# Patient Record
Sex: Male | Born: 1945
Health system: Southern US, Community
[De-identification: ages and names within clinical notes are randomized; demographics above are authoritative.]

## PROBLEM LIST (undated history)

## (undated) DIAGNOSIS — E119 Type 2 diabetes mellitus without complications: Secondary | ICD-10-CM

## (undated) DIAGNOSIS — I10 Essential (primary) hypertension: Secondary | ICD-10-CM

## (undated) DIAGNOSIS — N189 Chronic kidney disease, unspecified: Secondary | ICD-10-CM

## (undated) HISTORY — DX: Type 2 diabetes mellitus without complications: E11.9

## (undated) HISTORY — DX: Chronic kidney disease, unspecified: N18.9

---

## 1998-06-10 ENCOUNTER — Emergency Department (HOSPITAL_COMMUNITY): Admission: EM | Admit: 1998-06-10 | Discharge: 1998-06-10 | Payer: Self-pay

## 2005-02-20 ENCOUNTER — Emergency Department (HOSPITAL_COMMUNITY): Admission: EM | Admit: 2005-02-20 | Discharge: 2005-02-20 | Payer: Self-pay | Admitting: Emergency Medicine

## 2011-05-24 ENCOUNTER — Encounter: Payer: Self-pay | Admitting: Vascular Surgery

## 2013-11-14 ENCOUNTER — Emergency Department (HOSPITAL_COMMUNITY)
Admission: EM | Admit: 2013-11-14 | Discharge: 2013-11-14 | Disposition: A | Discharge status: Home / Self Care | Payer: Worker's Compensation | Attending: Emergency Medicine | Admitting: Emergency Medicine

## 2013-11-14 ENCOUNTER — Emergency Department (HOSPITAL_COMMUNITY): Payer: Worker's Compensation

## 2013-11-14 ENCOUNTER — Encounter (HOSPITAL_COMMUNITY): Payer: Self-pay | Admitting: Emergency Medicine

## 2013-11-14 DIAGNOSIS — S50311A Abrasion of right elbow, initial encounter: Secondary | ICD-10-CM | POA: Insufficient documentation

## 2013-11-14 DIAGNOSIS — Z79899 Other long term (current) drug therapy: Secondary | ICD-10-CM | POA: Insufficient documentation

## 2013-11-14 DIAGNOSIS — Y92410 Unspecified street and highway as the place of occurrence of the external cause: Secondary | ICD-10-CM | POA: Diagnosis not present

## 2013-11-14 DIAGNOSIS — S20411A Abrasion of right back wall of thorax, initial encounter: Secondary | ICD-10-CM | POA: Diagnosis not present

## 2013-11-14 DIAGNOSIS — S80212A Abrasion, left knee, initial encounter: Secondary | ICD-10-CM | POA: Diagnosis not present

## 2013-11-14 DIAGNOSIS — I10 Essential (primary) hypertension: Secondary | ICD-10-CM | POA: Diagnosis not present

## 2013-11-14 DIAGNOSIS — Z87891 Personal history of nicotine dependence: Secondary | ICD-10-CM | POA: Diagnosis not present

## 2013-11-14 DIAGNOSIS — S4991XA Unspecified injury of right shoulder and upper arm, initial encounter: Secondary | ICD-10-CM | POA: Diagnosis not present

## 2013-11-14 DIAGNOSIS — Y998 Other external cause status: Secondary | ICD-10-CM | POA: Insufficient documentation

## 2013-11-14 DIAGNOSIS — S161XXA Strain of muscle, fascia and tendon at neck level, initial encounter: Secondary | ICD-10-CM | POA: Diagnosis not present

## 2013-11-14 DIAGNOSIS — S0990XA Unspecified injury of head, initial encounter: Secondary | ICD-10-CM | POA: Diagnosis present

## 2013-11-14 DIAGNOSIS — Z23 Encounter for immunization: Secondary | ICD-10-CM | POA: Insufficient documentation

## 2013-11-14 DIAGNOSIS — Y9389 Activity, other specified: Secondary | ICD-10-CM | POA: Diagnosis not present

## 2013-11-14 DIAGNOSIS — S0181XA Laceration without foreign body of other part of head, initial encounter: Secondary | ICD-10-CM | POA: Diagnosis not present

## 2013-11-14 DIAGNOSIS — M25511 Pain in right shoulder: Secondary | ICD-10-CM

## 2013-11-14 DIAGNOSIS — T148XXA Other injury of unspecified body region, initial encounter: Secondary | ICD-10-CM

## 2013-11-14 HISTORY — DX: Essential (primary) hypertension: I10

## 2013-11-14 LAB — COMPREHENSIVE METABOLIC PANEL
ALT: 36 U/L (ref 0–53)
AST: 31 U/L (ref 0–37)
Albumin: 3.9 g/dL (ref 3.5–5.2)
Alkaline Phosphatase: 54 U/L (ref 39–117)
Anion gap: 16 — ABNORMAL HIGH (ref 5–15)
BILIRUBIN TOTAL: 0.8 mg/dL (ref 0.3–1.2)
BUN: 19 mg/dL (ref 6–23)
CHLORIDE: 101 meq/L (ref 96–112)
CO2: 25 mEq/L (ref 19–32)
Calcium: 9.9 mg/dL (ref 8.4–10.5)
Creatinine, Ser: 1.12 mg/dL (ref 0.50–1.35)
GFR calc Af Amer: 76 mL/min — ABNORMAL LOW (ref >90)
GFR calc non Af Amer: 66 mL/min — ABNORMAL LOW (ref >90)
Glucose, Bld: 237 mg/dL — ABNORMAL HIGH (ref 70–99)
POTASSIUM: 4.1 meq/L (ref 3.7–5.3)
Sodium: 142 mEq/L (ref 137–147)
Total Protein: 7.7 g/dL (ref 6.0–8.3)

## 2013-11-14 LAB — CBC WITH DIFFERENTIAL/PLATELET
BASOS PCT: 0 % (ref 0–1)
Basophils Absolute: 0 10*3/uL (ref 0.0–0.1)
Eosinophils Absolute: 0 10*3/uL (ref 0.0–0.7)
Eosinophils Relative: 0 % (ref 0–5)
HCT: 47 % (ref 39.0–52.0)
Hemoglobin: 16.9 g/dL (ref 13.0–17.0)
Lymphocytes Relative: 8 % — ABNORMAL LOW (ref 12–46)
Lymphs Abs: 1.2 10*3/uL (ref 0.7–4.0)
MCH: 34.3 pg — ABNORMAL HIGH (ref 26.0–34.0)
MCHC: 36 g/dL (ref 30.0–36.0)
MCV: 95.5 fL (ref 78.0–100.0)
Monocytes Absolute: 0.9 10*3/uL (ref 0.1–1.0)
Monocytes Relative: 6 % (ref 3–12)
NEUTROS ABS: 12.6 10*3/uL — AB (ref 1.7–7.7)
NEUTROS PCT: 86 % — AB (ref 43–77)
PLATELETS: 183 10*3/uL (ref 150–400)
RBC: 4.92 MIL/uL (ref 4.22–5.81)
RDW: 12.5 % (ref 11.5–15.5)
WBC: 14.7 10*3/uL — ABNORMAL HIGH (ref 4.0–10.5)

## 2013-11-14 LAB — TROPONIN I

## 2013-11-14 MED ORDER — HYDROCODONE-ACETAMINOPHEN 5-325 MG PO TABS: 1.0000 | ORAL_TABLET | Freq: Four times a day (QID) | ORAL | Status: DC | PRN

## 2013-11-14 MED ORDER — LIDOCAINE HCL (PF) 1 % IJ SOLN: 5.0000 mL | Freq: Once | INTRAMUSCULAR | Status: DC

## 2013-11-14 MED ORDER — BACITRACIN 500 UNIT/GM EX OINT
1.0000 "application " | TOPICAL_OINTMENT | Freq: Every day | CUTANEOUS | Status: DC
  Administered 2013-11-14: 1 via TOPICAL
  Filled 2013-11-14: qty 28.4

## 2013-11-14 MED ORDER — SODIUM CHLORIDE 0.9 % IV BOLUS (SEPSIS)
1000.0000 mL | Freq: Once | INTRAVENOUS | Status: AC
Start: 2013-11-14 — End: 2013-11-14
  Administered 2013-11-14: 1000 mL via INTRAVENOUS

## 2013-11-14 MED ORDER — LIDOCAINE HCL (PF) 1 % IJ SOLN
20.0000 mL | Freq: Once | INTRAMUSCULAR | Status: AC
  Administered 2013-11-14: 20 mL
  Filled 2013-11-14 (×2): qty 20

## 2013-11-14 MED ORDER — TETANUS-DIPHTH-ACELL PERTUSSIS 5-2.5-18.5 LF-MCG/0.5 IM SUSP
0.5000 mL | Freq: Once | INTRAMUSCULAR | Status: AC
  Administered 2013-11-14: 0.5 mL via INTRAMUSCULAR
  Filled 2013-11-14: qty 0.5
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
## 2013-11-14 NOTE — ED Notes (Signed)
C-collar applied

## 2013-11-14 NOTE — ED Notes (Signed)
Ambulates in hall without difficulty and stats remain in the upper ninetys

## 2013-11-14 NOTE — ED Notes (Signed)
Returned from Radiology.
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
## 2013-11-14 NOTE — Discharge Instructions (Signed)
Please call your doctor for a followup appointment within 24-48 hours. When you talk to your doctor please let them know that you were seen in the emergency department and have them acquire all of your records so that they can discuss the findings with you and formulate a treatment plan to fully care for your new and ongoing problems. Please call and set-up an appointment with your primary care provider to be seen and re-assessed  Please rest and stay hydrated Please avoid any physical or strenuous activity  Please wah wounds at least 3-4 times per day - with warm water and soap, massage gently, pat dry, apply antibiotic ointment and keep wounds covered Wounds need to be re-assessed within 3 days and removed within 5-7 days Please take medications as prescribed - while on pain medications there is to be no drinking alcohol, driving, operating any heavy machinery. If extra please dispose in a proper manner. Please do not take any extra Tylenol with this medication for this can lead to Tylenol overdose and liver issues.  Please continue to monitor symptoms closely and if symptoms are to worsen or change (fever greater than 101, chills, sweating, nausea, vomiting, chest pain, shortness of breathe, difficulty breathing, weakness, numbness, tingling, worsening or changes to pain pattern, fall, injury, dizziness, headaches, visual changes, neck pain, neck stiffness, stomach pain, weakness to the legs, inability to control urine or bowel movements) please report back to the Emergency Department immediately.   Facial Laceration A facial laceration is a cut on the face. These injuries can be painful and cause bleeding. Some cuts may need to be closed with stitches (sutures), skin adhesive strips, or wound glue. Cuts usually heal quickly but can leave a scar. It can take 1-2 years for the scar to go away completely. HOME CARE   Only take medicines as told by your doctor.  Follow your doctor's instructions for  wound care. For Stitches:  Keep the cut clean and dry.  If you have a bandage (dressing), change it at least once a day. Change the bandage if it gets wet or dirty, or as told by your doctor.  Wash the cut with soap and water 2 times a day. Rinse the cut with water. Pat it dry with a clean towel.  Put a thin layer of medicated cream on the cut as told by your doctor.  You may shower after the first 24 hours. Do not soak the cut in water until the stitches are removed.  Have your stitches removed as told by your doctor.  Do not wear any makeup until a few days after your stitches are removed. For Skin Adhesive Strips:  Keep the cut clean and dry.  Do not get the strips wet. You may take a bath, but be careful to keep the cut dry.  If the cut gets wet, pat it dry with a clean towel.  The strips will fall off on their own. Do not remove the strips that are still stuck to the cut. For Wound Glue:  You may shower or take baths. Do not soak or scrub the cut. Do not swim. Avoid heavy sweating until the glue falls off on its own. After a shower or bath, pat the cut dry with a clean towel.  Do not put medicine or makeup on your cut until the glue falls off.  If you have a bandage, do not put tape over the glue.  Avoid lots of sunlight or tanning lamps until the glue  falls off.  The glue will fall off on its own in 5-10 days. Do not pick at the glue. After Healing: Put sunscreen on the cut for the first year to reduce your scar. GET HELP RIGHT AWAY IF:   Your cut area gets red, painful, or puffy (swollen).  You see a yellowish-white fluid (pus) coming from the cut.  You have chills or a fever. MAKE SURE YOU:   Understand these instructions.  Will watch your condition.  Will get help right away if you are not doing well or get worse. Document Released: 06/08/2007 Document Revised: 10/10/2012 Document Reviewed: 08/02/2012 [facility] Patient Information 2015 [place], [place].  This information is not intended to replace advice given to you by your health care provider. Make sure you discuss any questions you have with your health care provider.  Cervical Sprain A cervical sprain is when the tissues (ligaments) that hold the neck bones in place stretch or tear. HOME CARE   Put ice on the injured area.  Put ice in a plastic bag.  Place a towel between your skin and the bag.  Leave the ice on for 15-20 minutes, 3-4 times a day.  You may have been given a collar to wear. This collar keeps your neck from moving while you heal.  Do not take the collar off unless told by your doctor.  If you have long hair, keep it outside of the collar.  Ask your doctor before changing the position of your collar. You may need to change its position over time to make it more comfortable.  If you are allowed to take off the collar for cleaning or bathing, follow your doctor's instructions on how to do it safely.  Keep your collar clean by wiping it with mild soap and water. Dry it completely. If the collar has removable pads, remove them every 1-2 days to hand wash them with soap and water. Allow them to air dry. They should be dry before you wear them in the collar.  Do not drive while wearing the collar.  Only take medicine as told by your doctor.  Keep all doctor visits as told.  Keep all physical therapy visits as told.  Adjust your work station so that you have good posture while you work.  Avoid positions and activities that make your problems worse.  Warm up and stretch before being active. GET HELP IF:  Your pain is not controlled with medicine.  You cannot take less pain medicine over time as planned.  Your activity level does not improve as expected. GET HELP RIGHT AWAY IF:   You are bleeding.  Your stomach is upset.  You have an allergic reaction to your medicine.  You develop new problems that you cannot explain.  You lose feeling (become numb)  or you cannot move any part of your body (paralysis).  You have tingling or weakness in any part of your body.  Your symptoms get worse. Symptoms include:  Pain, soreness, stiffness, puffiness (swelling), or a burning feeling in your neck.  Pain when your neck is touched.  Shoulder or upper back pain.  Limited ability to move your neck.  Headache.  Dizziness.  Your hands or arms feel week, lose feeling, or tingle.  Muscle spasms.  Difficulty swallowing or chewing. MAKE SURE YOU:   Understand these instructions.  Will watch your condition.  Will get help right away if you are not doing well or get worse. Document Released: 06/08/2007 Document Revised: 08/22/2012  Document Reviewed: 06/27/2012 [facility] Patient Information 2015 [place], [place]. This information is not intended to replace advice given to you by your health care provider. Make sure you discuss any questions you have with your health care provider.

## 2013-11-14 NOTE — ED Notes (Signed)
Patient transported to Radiology 

## 2014-09-23 ENCOUNTER — Encounter: Payer: Medicare Other | Attending: Family Medicine

## 2014-09-23 VITALS — Ht 74.0 in | Wt 265.6 lb

## 2014-09-23 DIAGNOSIS — E119 Type 2 diabetes mellitus without complications: Secondary | ICD-10-CM

## 2014-09-23 DIAGNOSIS — Z713 Dietary counseling and surveillance: Secondary | ICD-10-CM | POA: Insufficient documentation

## 2014-09-23 NOTE — Progress Notes (Signed)

## 2014-09-30 DIAGNOSIS — E119 Type 2 diabetes mellitus without complications: Secondary | ICD-10-CM | POA: Diagnosis not present

## 2014-09-30 NOTE — Progress Notes (Signed)

## 2014-10-07 ENCOUNTER — Encounter: Payer: Medicare Other | Attending: Family Medicine

## 2014-10-07 DIAGNOSIS — E119 Type 2 diabetes mellitus without complications: Secondary | ICD-10-CM | POA: Insufficient documentation

## 2014-10-07 DIAGNOSIS — Z713 Dietary counseling and surveillance: Secondary | ICD-10-CM | POA: Insufficient documentation

## 2014-10-08 NOTE — Progress Notes (Signed)
Patient was seen on 10/07/14 for the third of a series of three diabetes self-management courses at the [facility].   Timothy Barnes the amount of activity recommended for healthy living . Describe activities suitable for individual needs . Identify ways to regularly incorporate activity into daily life . Identify barriers to activity and ways to over come these barriers  Identify diabetes medications being personally used and their primary action for lowering glucose and possible side effects . Describe role of stress on blood glucose and develop strategies to address psychosocial issues . Identify diabetes complications and ways to prevent them  Explain how to manage diabetes during illness . Evaluate success in meeting personal goal . Establish 2-3 goals that they will plan to diligently work on until they return for the  41-month follow-up visit  Goals:   I will count my carb choices at most meals and snacks  I will be active   I will eat less unhealthy fats by portion control  To help manage stress with more personal time  Your patient has identified these potential barriers to change:  Motivation  Your patient has identified their diabetes self-care support plan as  On-line Resources Plan:  Attend Optional Core 4 in 4 months

## 2015-02-25 ENCOUNTER — Ambulatory Visit: Payer: Self-pay

## 2015-06-17 ENCOUNTER — Other Ambulatory Visit: Payer: Self-pay | Admitting: Family Medicine

## 2015-06-17 ENCOUNTER — Ambulatory Visit
Admission: RE | Admit: 2015-06-17 | Discharge: 2015-06-17 | Disposition: A | Discharge status: Home / Self Care | Payer: Medicare Other | Source: Ambulatory Visit | Attending: Family Medicine | Admitting: Family Medicine

## 2015-06-17 DIAGNOSIS — M545 Low back pain: Secondary | ICD-10-CM

## 2016-03-11 ENCOUNTER — Emergency Department (HOSPITAL_BASED_OUTPATIENT_CLINIC_OR_DEPARTMENT_OTHER): Payer: Medicare Other

## 2016-03-11 ENCOUNTER — Encounter (HOSPITAL_BASED_OUTPATIENT_CLINIC_OR_DEPARTMENT_OTHER): Payer: Self-pay | Admitting: *Deleted

## 2016-03-11 ENCOUNTER — Emergency Department (HOSPITAL_BASED_OUTPATIENT_CLINIC_OR_DEPARTMENT_OTHER)
Admission: EM | Admit: 2016-03-11 | Discharge: 2016-03-11 | Disposition: A | Discharge status: Home / Self Care | Payer: Medicare Other | Attending: Emergency Medicine | Admitting: Emergency Medicine

## 2016-03-11 DIAGNOSIS — M545 Low back pain: Secondary | ICD-10-CM | POA: Diagnosis present

## 2016-03-11 DIAGNOSIS — Z79899 Other long term (current) drug therapy: Secondary | ICD-10-CM | POA: Diagnosis not present

## 2016-03-11 DIAGNOSIS — I10 Essential (primary) hypertension: Secondary | ICD-10-CM | POA: Insufficient documentation

## 2016-03-11 DIAGNOSIS — E119 Type 2 diabetes mellitus without complications: Secondary | ICD-10-CM | POA: Insufficient documentation

## 2016-03-11 DIAGNOSIS — Z87891 Personal history of nicotine dependence: Secondary | ICD-10-CM | POA: Diagnosis not present

## 2016-03-11 DIAGNOSIS — M5441 Lumbago with sciatica, right side: Secondary | ICD-10-CM | POA: Diagnosis not present

## 2016-03-11 MED ORDER — LIDOCAINE 5 % EX PTCH: 1.0000 | MEDICATED_PATCH | CUTANEOUS | 0 refills | Status: DC

## 2016-03-11 MED ORDER — METHOCARBAMOL 500 MG PO TABS
500.0000 mg | ORAL_TABLET | Freq: Once | ORAL | Status: AC
  Administered 2016-03-11: 500 mg via ORAL
  Filled 2016-03-11: qty 1

## 2016-03-11 MED ORDER — IBUPROFEN 400 MG PO TABS: 400.0000 mg | ORAL_TABLET | Freq: Four times a day (QID) | ORAL | 0 refills | Status: AC | PRN

## 2016-03-11 MED ORDER — HYDROCODONE-ACETAMINOPHEN 5-325 MG PO TABS: 1.0000 | ORAL_TABLET | Freq: Four times a day (QID) | ORAL | 0 refills | Status: DC | PRN

## 2016-03-11 MED ORDER — HYDROCODONE-ACETAMINOPHEN 5-325 MG PO TABS
1.0000 | ORAL_TABLET | Freq: Once | ORAL | Status: AC
  Administered 2016-03-11: 1 via ORAL
  Filled 2016-03-11: qty 1

## 2016-03-11 MED ORDER — KETOROLAC TROMETHAMINE 60 MG/2ML IM SOLN
30.0000 mg | Freq: Once | INTRAMUSCULAR | Status: AC
  Administered 2016-03-11: 30 mg via INTRAMUSCULAR
  Filled 2016-03-11: qty 2

## 2016-03-11 MED ORDER — METHOCARBAMOL 500 MG PO TABS: 500.0000 mg | ORAL_TABLET | Freq: Two times a day (BID) | ORAL | 0 refills | Status: DC

## 2016-03-11 MED FILL — HYDROCODON-APAP 5-325: 5-325 | 3 days supply | Qty: 10 | Fill #0

## 2016-03-11 MED FILL — IBUPROFEN 400 MG TABLET: 400 | 8 days supply | Qty: 30 | Fill #0

## 2016-03-11 MED FILL — METHOCARBAMOL 500 MG TABLET: 500 | 10 days supply | Qty: 20 | Fill #0

## 2016-03-11 NOTE — Discharge Instructions (Signed)
Take it easy, but do not lay around too much as this may make any stiffness worse.   Antiinflammatory medications: Take ibuprofen or naproxen on a regular schedule over the next three days. Take these medications with food to avoid upset stomach. Choose only one of these medications, do not take them together.  Muscle relaxer: Robaxin is a muscle relaxer and may help loosen stiff muscles. Do not take the Robaxin while driving or performing other dangerous activities.   Vicodin: May take the Vicodin as needed for severe pain. Do not drive or perform other dangerous activities while taking the Vicodin. Note that Vicodin can increase the risk of things like unsteadiness and falls, so please use caution with this medication.  Lidocaine patches: These are available via either prescription or over-the-counter. The over-the-counter option may be more economical one and are likely just as effective. There are multiple over-the-counter brands, such as Salonpas.  Exercises: Be sure to perform the attached exercises starting with three times a week and working up to performing them daily. This is an essential part of preventing long term problems.   Follow up with a primary care provider for any future management of these complaints.

## 2016-03-11 NOTE — ED Triage Notes (Signed)
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

## 2016-03-11 NOTE — ED Provider Notes (Signed)
[facility]   CSN: [id number] Arrival date & time: 03/11/16  1024     History   Chief Complaint Chief Complaint  Patient presents with  . Back Pain    HPI Timothy Barnes is a 71 y.o. male.  HPI   Timothy Barnes is a 71 y.o. male, with a history of HTN and borderline DM, presenting to the ED with right lower back pain. 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 Pain has been worsening. He has been spending the last 2 days in bed due to the pain. Patient has not tried anything for his pain. Pain is worse with straightening the leg and ambulating. He has a history of intermittent lower back pain, but states that this is a bit worse. He denies falls, neuro deficits, changes in bowel or bladder function, or any other complaints.    Past Medical History:  Diagnosis Date  . Diabetes mellitus without complication (HCC)   . Hypertension     There are no active problems to display for this patient.   History reviewed. No pertinent surgical history.     Home Medications    Prior to Admission medications   Medication Sig Start Date End Date Taking? Authorizing Provider  atorvastatin (LIPITOR) 10 MG tablet Take 10 mg by mouth daily.   Yes Historical Provider, MD  albuterol (PROVENTIL HFA;VENTOLIN HFA) 108 (90 BASE) MCG/ACT inhaler Inhale 2 puffs into the lungs every 6 (six) hours as needed for wheezing or shortness of breath.    Historical Provider, MD  albuterol (PROVENTIL) (2.5 MG/3ML) 0.083% nebulizer solution Take 2.5 mg by nebulization every 6 (six) hours as needed for wheezing or shortness of breath.    Historical Provider, MD  HYDROcodone-acetaminophen (NORCO/VICODIN) 5-325 MG tablet Take 1 tablet by mouth every 6 (six) hours as needed. 03/11/16   Ian Castagna C Veanna Dower, PA-C  ibuprofen (ADVIL,MOTRIN) 400 MG tablet Take 1 tablet (400 mg total) by mouth  every 6 (six) hours as needed. 03/11/16   Santino Kinsella C Patsy Varma, PA-C  lidocaine (LIDODERM) 5 % Place 1 patch onto the skin daily. Remove & Discard patch within 12 hours or as directed by MD 03/11/16   Anselm Pancoast, PA-C  losartan-hydrochlorothiazide (HYZAAR) 100-12.5 MG per tablet Take 1 tablet by mouth daily.    Historical Provider, MD  methocarbamol (ROBAXIN) 500 MG tablet Take 1 tablet (500 mg total) by mouth 2 (two) times daily. 03/11/16   Sanyia Dini C Lilleigh Hechavarria, PA-C  metoprolol (LOPRESSOR) 50 MG tablet Take 50 mg by mouth 2 (two) times daily.    Historical Provider, MD  omeprazole (PRILOSEC) 40 MG capsule Take 40 mg by mouth daily.    Historical Provider, MD    Family History History reviewed. No pertinent family history.  Social History Social History  Substance Use Topics  . Smoking status: Former Smoker    Quit date: 11/15/1982  . Smokeless tobacco: Never Used  . Alcohol use 0.6 oz/week    1 Cans of beer per week     Comment: one beer daily     Allergies   Amlodipine and Hctz [hydrochlorothiazide]   Review of Systems Review of Systems  Gastrointestinal: Negative for nausea and vomiting.  Genitourinary: Negative for difficulty urinating.  Musculoskeletal: Positive for back pain.  Neurological: Negative for weakness and numbness.     Physical Exam Updated  Vital Signs BP 149/70 (BP Location: Right Arm)   Pulse (!) 52   Temp 98.7 F (37.1 C) (Oral)   Resp 18   Ht 6\' 2"  (1.88 m)   Wt 105.2 kg   SpO2 98%   BMI 29.79 kg/m   Physical Exam  Constitutional: He appears well-developed and well-nourished. No distress.  HENT:  Head: Normocephalic and atraumatic.  Eyes: Conjunctivae are normal.  Neck: Neck supple.  Cardiovascular: Normal rate, regular rhythm and intact distal pulses.   Pulmonary/Chest: Effort normal.  Musculoskeletal: He exhibits tenderness.  Tenderness to the right lumbar musculature into the right buttocks. Full range of motion in each cardinal direction of the right hip.    Neurological: He is alert.  No sensory deficits. Strength 5/5 in both lower extremities at the ankles, knees, and hips. Patient initially has an antalgic gait. Coordination intact. Following pain management, patient's gait becomes steady and even.  Skin: Skin is warm and dry. He is not diaphoretic.  Psychiatric: He has a normal mood and affect. His behavior is normal.  Nursing note and vitals reviewed.    ED Treatments / Results  Labs (all labs ordered are listed, but only abnormal results are displayed) Labs Reviewed - No data to display  EKG  EKG Interpretation None       Radiology Dg Lumbar Spine Complete  Result Date: 03/11/2016 CLINICAL DATA:  Chronic mid and low back pain radiating into the right leg. No known injury. EXAM: LUMBAR SPINE - COMPLETE 4+ VIEW COMPARISON:  Plain films lumbar spine 06/17/2015. FINDINGS: No fracture or malalignment. Loss of disc space height is seen at L5-S1 where there is prominent endplate spurring. The patient has bilateral L5 pars interarticularis defects with associated trace anterolisthesis L5 on S1. Aortic atherosclerosis is noted. 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 Chronic bilateral L5 pars interarticularis defects with associated trace anterolisthesis L5 on S1 are also again seen. Atherosclerosis. Electronically Signed   By: Drusilla Kannerhomas  Dalessio M.D.   On: 03/11/2016 14:40   Dg Hip Unilat W Or Wo Pelvis 2-3 Views Right  Result Date: 03/11/2016 CLINICAL DATA:  Chronic right hip pain.  No known injury. EXAM: DG HIP (WITH OR WITHOUT PELVIS) 2-3V RIGHT COMPARISON:  None. FINDINGS: No acute bony or joint abnormality seen on the right or left. Mild bilateral hip degenerative disease appears symmetric from right to left. No evidence of avascular necrosis of the femoral heads. No focal bony lesion. IMPRESSION: No acute finding. Mild bilateral hip osteoarthritis. Electronically Signed   By: Drusilla Kannerhomas  Dalessio M.D.   On: 03/11/2016  14:41    Procedures Procedures (including critical care time)  Medications Ordered in ED Medications  ketorolac (TORADOL) injection 30 mg (30 mg Intramuscular Given 03/11/16 1511)  HYDROcodone-acetaminophen (NORCO/VICODIN) 5-325 MG per tablet 1 tablet (1 tablet Oral Given 03/11/16 1512)  methocarbamol (ROBAXIN) tablet 500 mg (500 mg Oral Given 03/11/16 1512)     Initial Impression / Assessment and Plan / ED Course  I have reviewed the triage vital signs and the nursing notes.  Pertinent labs & imaging results that were available during my care of the patient were reviewed by me and considered in my medical decision making (see chart for details).     Patient presents with symptoms consistent with sciatica. He has no neuro deficits. Voice significant improvement with pain management here in the ED. Able to ambulate without difficulty or assistance prior to discharge. PCP follow-up. Home care and return precautions discussed. Patient voices  understanding of all instructions and is comfortable with discharge.     Findings and plan of care discussed with Linwood Dibbles, MD. Dr. Lynelle Doctor personally evaluated and examined this patient.  Vitals:   03/11/16 1034 03/11/16 1329  BP: 155/76 149/70  Pulse: (!) 50 (!) 52  Resp: 18 18  Temp: 98.7 F (37.1 C)   TempSrc: Oral   SpO2: 97% 98%  Weight: 105.2 kg   Height: 6\' 2"  (1.88 m)      Final Clinical Impressions(s) / ED Diagnoses   Final diagnoses:  Acute right-sided low back pain with right-sided sciatica    New Prescriptions New Prescriptions   HYDROCODONE-ACETAMINOPHEN (NORCO/VICODIN) 5-325 MG TABLET    Take 1 tablet by mouth every 6 (six) hours as needed.   IBUPROFEN (ADVIL,MOTRIN) 400 MG TABLET    Take 1 tablet (400 mg total) by mouth every 6 (six) hours as needed.   LIDOCAINE (LIDODERM) 5 %    Place 1 patch onto the skin daily. Remove & Discard patch within 12 hours or as directed by MD   METHOCARBAMOL (ROBAXIN) 500 MG TABLET    Take  1 tablet (500 mg total) by mouth 2 (two) times daily.     Anselm Pancoast, PA-C 03/11/16 1616    Linwood Dibbles, MD 03/13/16 1434

## 2016-03-11 NOTE — ED Notes (Signed)
Family at bedside. 

## 2016-03-11 NOTE — ED Notes (Signed)
Pt ambulates in hallway with slow, steady gait in nad. With sara, rn

## 2017-12-22 ENCOUNTER — Other Ambulatory Visit: Payer: Self-pay | Admitting: Family Medicine

## 2017-12-22 DIAGNOSIS — Z87891 Personal history of nicotine dependence: Secondary | ICD-10-CM

## 2018-01-02 ENCOUNTER — Ambulatory Visit
Admission: RE | Admit: 2018-01-02 | Discharge: 2018-01-02 | Disposition: A | Discharge status: Home / Self Care | Payer: Medicare Other | Source: Ambulatory Visit | Attending: Family Medicine | Admitting: Family Medicine

## 2018-01-02 DIAGNOSIS — Z87891 Personal history of nicotine dependence: Secondary | ICD-10-CM

## 2018-02-09 ENCOUNTER — Encounter: Payer: Medicare Other | Attending: Family Medicine | Admitting: Dietician

## 2018-02-09 ENCOUNTER — Encounter: Payer: Self-pay | Admitting: Dietician

## 2018-02-09 DIAGNOSIS — N183 Chronic kidney disease, stage 3 unspecified: Secondary | ICD-10-CM

## 2018-02-09 DIAGNOSIS — R7303 Prediabetes: Secondary | ICD-10-CM | POA: Insufficient documentation

## 2018-02-09 NOTE — Patient Instructions (Signed)
Drink more water. Choose beverages without sugar. Stay active.  Aim for 30 minutes most days. Eat more non starchy vegetables.  This should be 1/2 you plate. Choose foods that are baked, boiled, broiled or grilled rather than fried most often. Take the skin off the chicken and choose lean meats.   Eat slowly and stop when you are satisfied. Choose whole grains (Whole wheat bread, brown rice for example). Small amounts of healthy fats.

## 2018-02-09 NOTE — Progress Notes (Signed)
Medical Nutrition Therapy:  Appt start time: 0930 end time:  1015.   Assessment:  Primary concerns today: Patient was referred for prediabetes.  He also has worsening kidney function- now stage 3 CKD. Patient is here today alone.  History includes HTN, hypercholesterolemia, GERD, ED.  Weight today 254 lbs.  Stable overall. Lowest adult weight 185 lbs (until 1984 when he quit smoking and gained to 200 lbs). Highest weight 254 lbs.  Patient lives with his wife.  Eats out every lunch and dinner.  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 He only gets a little exercise on the job or when he travels. He states that he only drinks coffee in the am and water with meals.  States that he does not drink much water in the cooler seasons.  Preferred Learning Style:   No preference indicated   Learning Readiness:   Contemplating  MEDICATIONS: see list   DIETARY INTAKE: Eats out 2 times daily.  No added salt. 24-hr recall:  B ( AM): 2-3 chocolate chip or other cookies, black coffee  Snk ( AM): none  L ( PM): sometimes skips OR tacos, burgers or fried fish Snk ( PM): none D ( PM): [place] or [employer] or [employer] or [employer] (grilled chicken, onions, potatoes, corn, roll Snk ( PM): occasional chocolate milk, cookie Beverages: black coffee, sweet tea, occasional regular soda (1x/week), occasional beer or wine  Usual physical activity: none, walking when camping (4-5 times per year)  Progress Towards Goal(s):  In progress.   Nutritional Diagnosis:  NB-1.1 Food and nutrition-related knowledge deficit As related to balance of carbohydrate, protein, fat.  As evidenced by diet hx and patient report.    Intervention:  Nutrition education related to prediabetes and CKD.  Discussed need to increase his water intake, increase exercise and benefits as well as decrease fat intake (particularly saturated fat) and portion sizes.  Discussed sources of saturated fat.   Also discussed increasing non starchy vegetables.  Drink more water. Choose beverages without sugar. Stay active.  Aim for 30 minutes most days. Eat more non starchy vegetables.  This should be 1/2 you plate. Choose foods that are baked, boiled, broiled or grilled rather than fried most often. Take the skin off the chicken and choose lean meats.   Eat slowly and stop when you are satisfied. Choose whole grains (Whole wheat bread, brown rice for example). Small amounts of healthy fats.  Teaching Method Utilized:  Visual Auditory  Handouts given during visit include:  ADA diabetes your take control guide  Eating out tips  Meal plan card  Snack sheet  Barriers to learning/adherence to lifestyle change: eats out most often and willingness to change  Demonstrated degree of understanding via:  Teach Back   Monitoring/Evaluation:  Dietary intake, exercise, and body weight prn.

## 2019-01-23 ENCOUNTER — Other Ambulatory Visit: Payer: Medicare Other

## 2019-03-23 IMAGING — US US ABDOMINAL AORTA SCREENING AAA
1 series · 14 of 16 positions shown · non-contrast
Comparison: None.

CLINICAL DATA: Male between 65-75 years of age with a smoking
history.

EXAM:
US ABDOMINAL AORTA MEDICARE SCREENING
TECHNIQUE: Ultrasound examination of the abdominal aorta was performed as a
screening evaluation for abdominal aortic aneurysm.

[Series 1: us abdominal aorta screening aaa · 0.33mm/px · 14 of 16 slices shown]
[im 1/16]
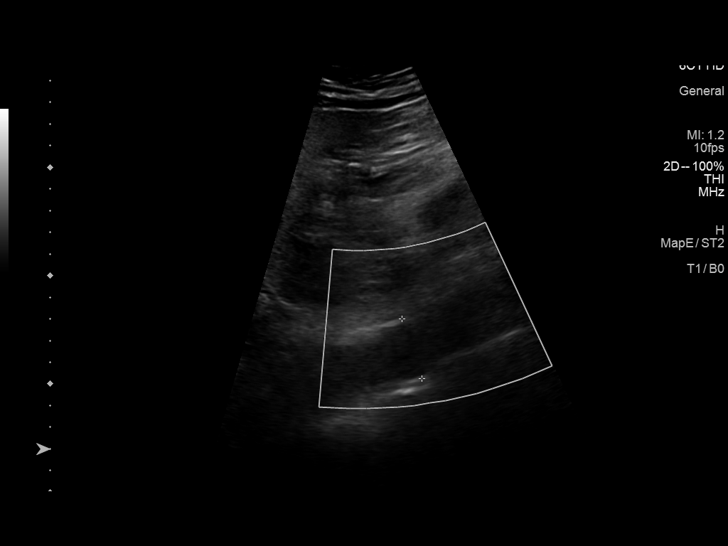
[im 2/16]
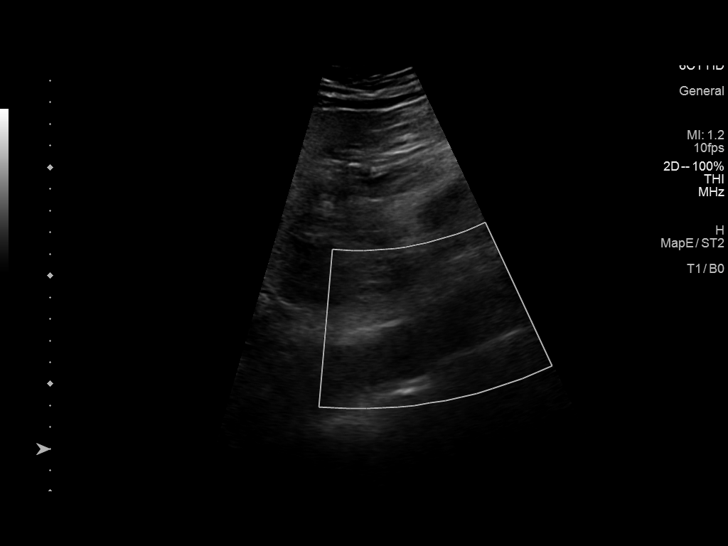
[im 3/16]
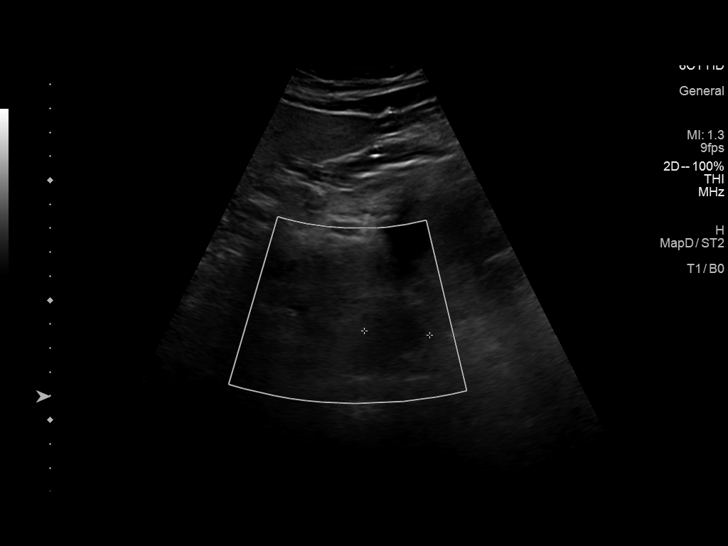
[im 5/16]
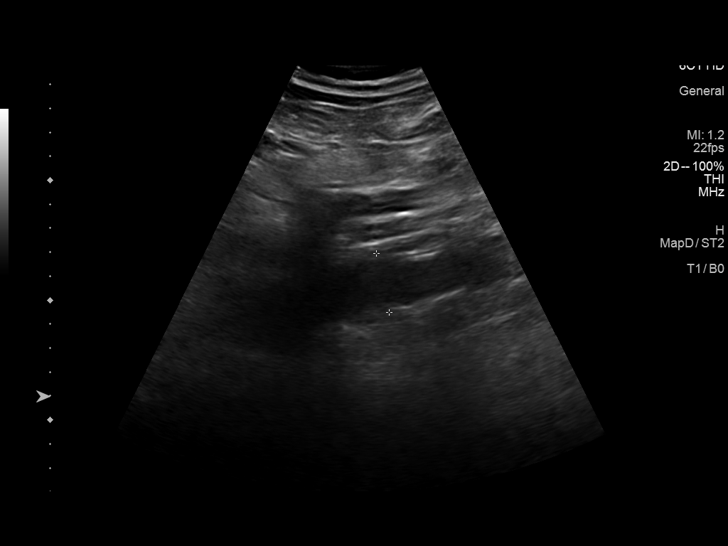
[im 6/16]
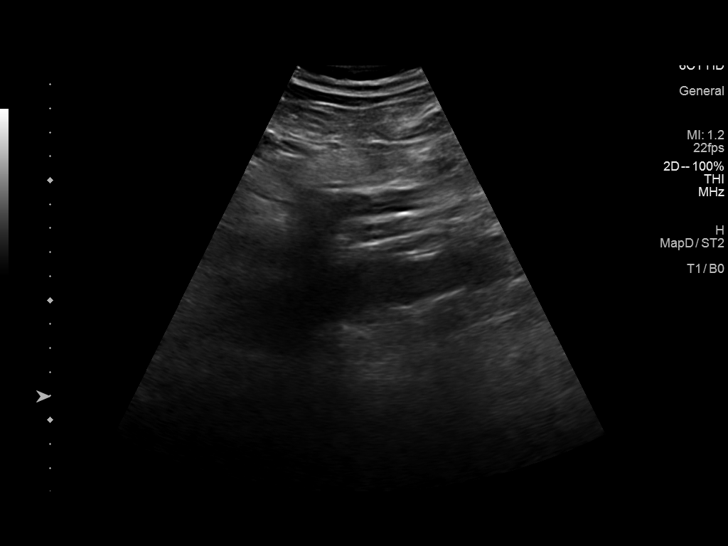
[im 7/16]
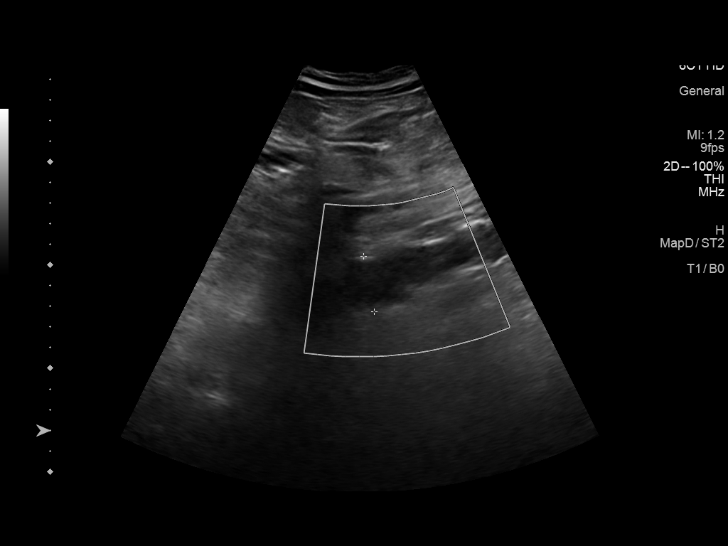
[im 8/16]
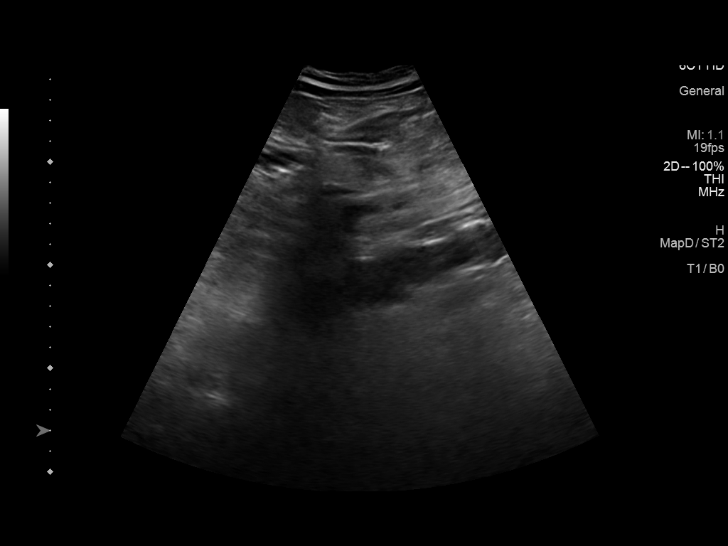
[im 9/16]
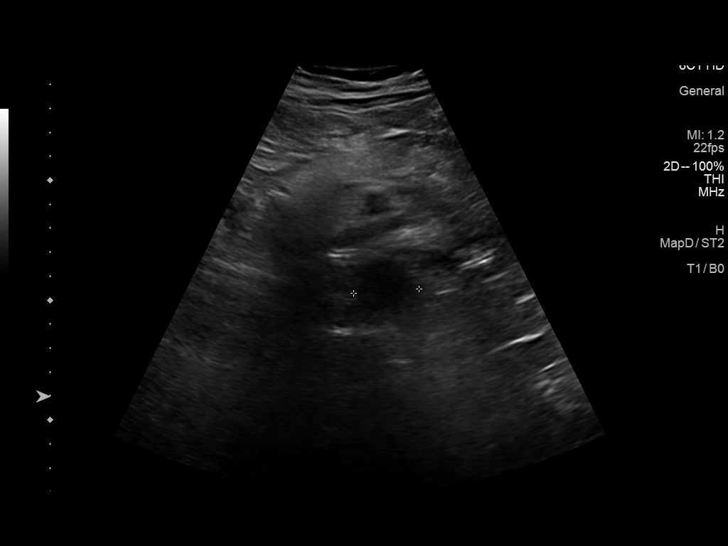
[im 10/16]
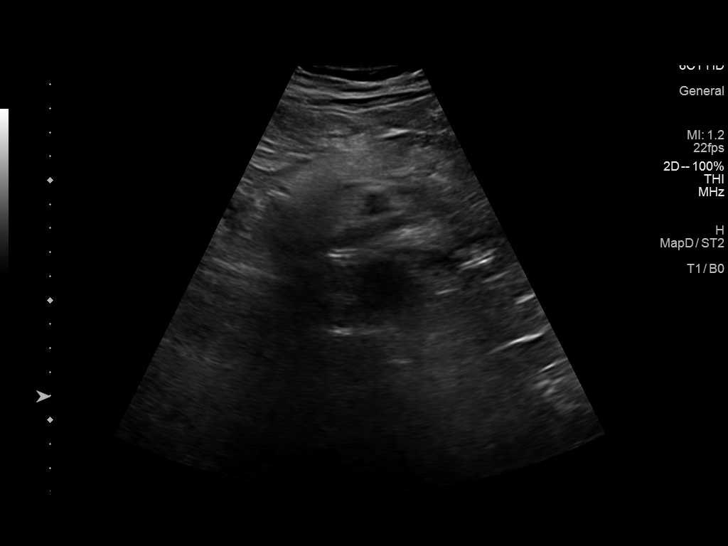
[im 11/16]
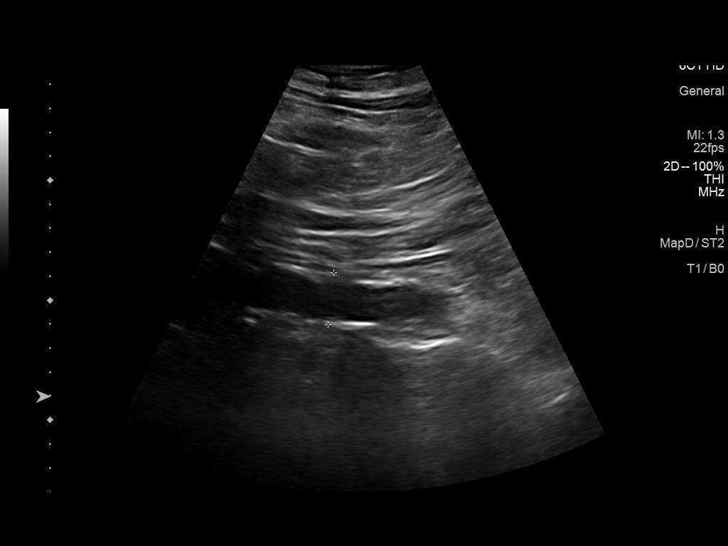
[im 13/16]
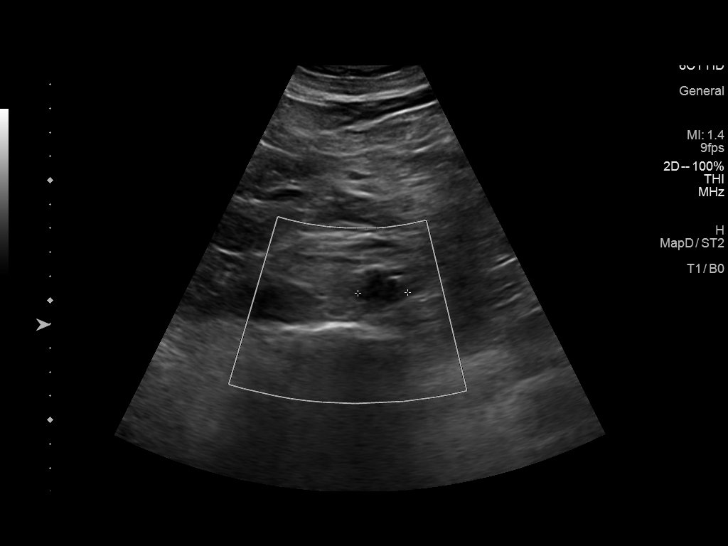
[im 14/16]
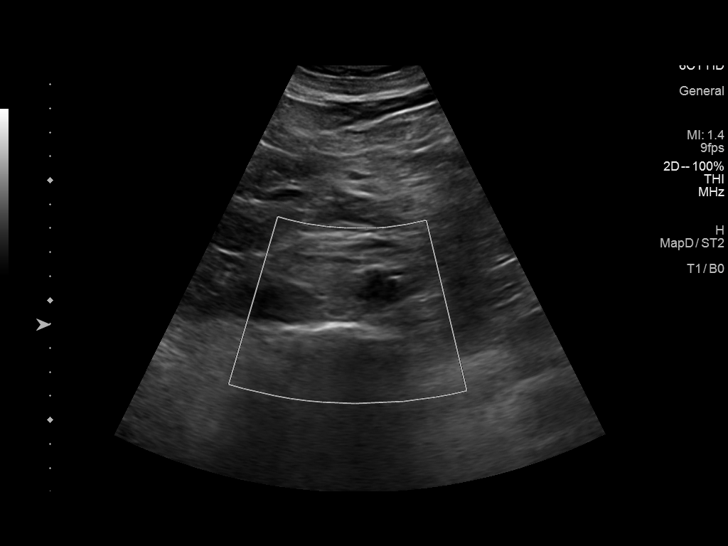
[im 15/16]
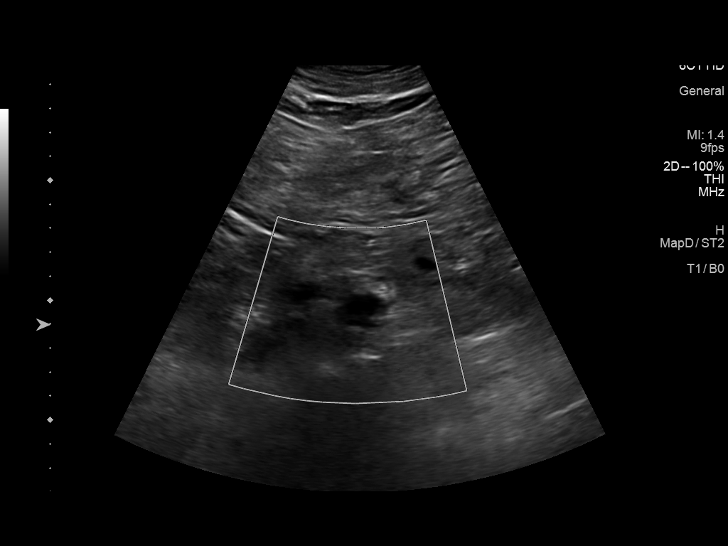
[im 16/16]
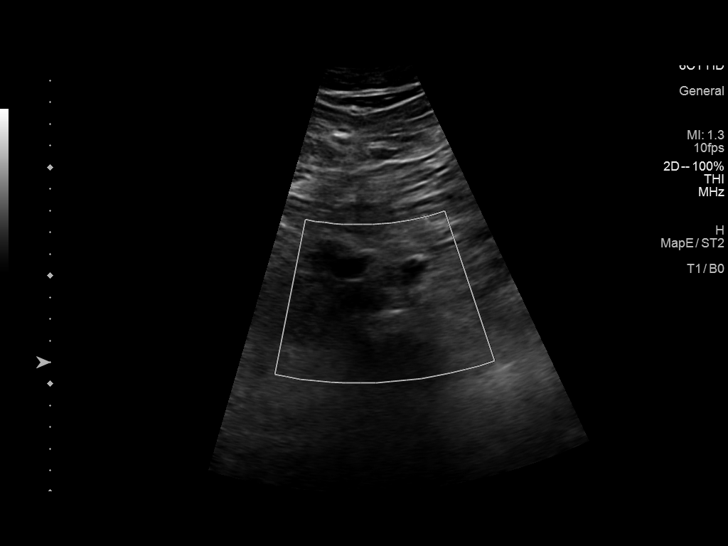

[14 of 16 positions shown; findings below may reference images not displayed]

FINDINGS: Abdominal aortic measurements as follows:

Proximal:  2.9 cm AP x 2.7 cm transversely

Mid:  2.7 cm AP x 2.7 cm transversely

Distal:  2.2 cm AP x 2.1 cm transversely.
IMPRESSION: No abdominal aortic aneurysm. Ectatic abdominal aorta at risk for
aneurysm development. Recommend followup by ultrasound in 5 years.
This recommendation follows ACR consensus guidelines: White Paper of
the ACR Incidental Findings Committee II on Vascular Findings. [HOSPITAL] 8161; [DATE].

## 2020-01-30 DIAGNOSIS — J449 Chronic obstructive pulmonary disease, unspecified: Secondary | ICD-10-CM | POA: Diagnosis not present

## 2020-01-30 DIAGNOSIS — E7800 Pure hypercholesterolemia, unspecified: Secondary | ICD-10-CM | POA: Diagnosis not present

## 2020-01-30 DIAGNOSIS — I10 Essential (primary) hypertension: Secondary | ICD-10-CM | POA: Diagnosis not present

## 2020-01-30 DIAGNOSIS — K219 Gastro-esophageal reflux disease without esophagitis: Secondary | ICD-10-CM | POA: Diagnosis not present

## 2020-04-26 DIAGNOSIS — R972 Elevated prostate specific antigen [PSA]: Secondary | ICD-10-CM | POA: Insufficient documentation

## 2020-04-26 DIAGNOSIS — E7800 Pure hypercholesterolemia, unspecified: Secondary | ICD-10-CM | POA: Insufficient documentation

## 2020-04-26 DIAGNOSIS — E663 Overweight: Secondary | ICD-10-CM | POA: Insufficient documentation

## 2020-04-26 DIAGNOSIS — R7303 Prediabetes: Secondary | ICD-10-CM | POA: Insufficient documentation

## 2020-04-26 DIAGNOSIS — I10 Essential (primary) hypertension: Secondary | ICD-10-CM | POA: Insufficient documentation

## 2020-04-26 DIAGNOSIS — R7301 Impaired fasting glucose: Secondary | ICD-10-CM | POA: Insufficient documentation

## 2020-04-26 DIAGNOSIS — Z87891 Personal history of nicotine dependence: Secondary | ICD-10-CM | POA: Insufficient documentation

## 2020-04-26 DIAGNOSIS — N529 Male erectile dysfunction, unspecified: Secondary | ICD-10-CM | POA: Insufficient documentation

## 2020-04-26 DIAGNOSIS — J449 Chronic obstructive pulmonary disease, unspecified: Secondary | ICD-10-CM | POA: Insufficient documentation

## 2020-04-26 DIAGNOSIS — E6609 Other obesity due to excess calories: Secondary | ICD-10-CM | POA: Insufficient documentation

## 2020-04-26 DIAGNOSIS — K219 Gastro-esophageal reflux disease without esophagitis: Secondary | ICD-10-CM | POA: Insufficient documentation

## 2020-04-26 DIAGNOSIS — Z860100 Personal history of colon polyps, unspecified: Secondary | ICD-10-CM | POA: Insufficient documentation

## 2020-06-16 DIAGNOSIS — R059 Cough, unspecified: Secondary | ICD-10-CM | POA: Diagnosis not present

## 2020-07-13 DIAGNOSIS — R198 Other specified symptoms and signs involving the digestive system and abdomen: Secondary | ICD-10-CM | POA: Diagnosis not present

## 2020-07-13 DIAGNOSIS — I10 Essential (primary) hypertension: Secondary | ICD-10-CM | POA: Diagnosis not present

## 2020-07-13 DIAGNOSIS — E1169 Type 2 diabetes mellitus with other specified complication: Secondary | ICD-10-CM | POA: Diagnosis not present

## 2020-07-13 DIAGNOSIS — E7800 Pure hypercholesterolemia, unspecified: Secondary | ICD-10-CM | POA: Diagnosis not present

## 2020-08-18 DIAGNOSIS — K219 Gastro-esophageal reflux disease without esophagitis: Secondary | ICD-10-CM | POA: Diagnosis not present

## 2020-08-18 DIAGNOSIS — E7800 Pure hypercholesterolemia, unspecified: Secondary | ICD-10-CM | POA: Diagnosis not present

## 2020-08-18 DIAGNOSIS — E1169 Type 2 diabetes mellitus with other specified complication: Secondary | ICD-10-CM | POA: Diagnosis not present

## 2020-08-18 DIAGNOSIS — I10 Essential (primary) hypertension: Secondary | ICD-10-CM | POA: Diagnosis not present

## 2020-08-18 DIAGNOSIS — J449 Chronic obstructive pulmonary disease, unspecified: Secondary | ICD-10-CM | POA: Diagnosis not present

## 2020-10-20 DIAGNOSIS — I10 Essential (primary) hypertension: Secondary | ICD-10-CM | POA: Diagnosis not present

## 2020-10-20 DIAGNOSIS — K219 Gastro-esophageal reflux disease without esophagitis: Secondary | ICD-10-CM | POA: Diagnosis not present

## 2020-10-20 DIAGNOSIS — E1169 Type 2 diabetes mellitus with other specified complication: Secondary | ICD-10-CM | POA: Diagnosis not present

## 2020-10-20 DIAGNOSIS — E7800 Pure hypercholesterolemia, unspecified: Secondary | ICD-10-CM | POA: Diagnosis not present

## 2020-10-20 DIAGNOSIS — J449 Chronic obstructive pulmonary disease, unspecified: Secondary | ICD-10-CM | POA: Diagnosis not present

## 2020-12-25 DIAGNOSIS — Z0000 Encounter for general adult medical examination without abnormal findings: Secondary | ICD-10-CM | POA: Diagnosis not present

## 2021-01-13 DIAGNOSIS — E7800 Pure hypercholesterolemia, unspecified: Secondary | ICD-10-CM | POA: Diagnosis not present

## 2021-01-13 DIAGNOSIS — I10 Essential (primary) hypertension: Secondary | ICD-10-CM | POA: Diagnosis not present

## 2021-01-13 DIAGNOSIS — K219 Gastro-esophageal reflux disease without esophagitis: Secondary | ICD-10-CM | POA: Diagnosis not present

## 2021-01-13 DIAGNOSIS — J449 Chronic obstructive pulmonary disease, unspecified: Secondary | ICD-10-CM | POA: Diagnosis not present

## 2021-01-13 DIAGNOSIS — E1169 Type 2 diabetes mellitus with other specified complication: Secondary | ICD-10-CM | POA: Diagnosis not present

## 2021-02-01 DIAGNOSIS — H10013 Acute follicular conjunctivitis, bilateral: Secondary | ICD-10-CM | POA: Diagnosis not present

## 2021-02-01 DIAGNOSIS — T1502XA Foreign body in cornea, left eye, initial encounter: Secondary | ICD-10-CM | POA: Diagnosis not present

## 2021-02-01 DIAGNOSIS — S0502XA Injury of conjunctiva and corneal abrasion without foreign body, left eye, initial encounter: Secondary | ICD-10-CM | POA: Diagnosis not present

## 2021-05-25 DIAGNOSIS — K219 Gastro-esophageal reflux disease without esophagitis: Secondary | ICD-10-CM | POA: Diagnosis not present

## 2021-05-25 DIAGNOSIS — J449 Chronic obstructive pulmonary disease, unspecified: Secondary | ICD-10-CM | POA: Diagnosis not present

## 2021-05-25 DIAGNOSIS — E1169 Type 2 diabetes mellitus with other specified complication: Secondary | ICD-10-CM | POA: Diagnosis not present

## 2021-05-25 DIAGNOSIS — I10 Essential (primary) hypertension: Secondary | ICD-10-CM | POA: Diagnosis not present

## 2021-05-25 DIAGNOSIS — E7800 Pure hypercholesterolemia, unspecified: Secondary | ICD-10-CM | POA: Diagnosis not present

## 2021-07-19 DIAGNOSIS — E1169 Type 2 diabetes mellitus with other specified complication: Secondary | ICD-10-CM | POA: Diagnosis not present

## 2021-07-19 DIAGNOSIS — J449 Chronic obstructive pulmonary disease, unspecified: Secondary | ICD-10-CM | POA: Diagnosis not present

## 2021-07-19 DIAGNOSIS — I10 Essential (primary) hypertension: Secondary | ICD-10-CM | POA: Diagnosis not present

## 2021-07-19 DIAGNOSIS — E7800 Pure hypercholesterolemia, unspecified: Secondary | ICD-10-CM | POA: Diagnosis not present

## 2021-12-28 DIAGNOSIS — N1831 Chronic kidney disease, stage 3a: Secondary | ICD-10-CM | POA: Diagnosis not present

## 2021-12-28 DIAGNOSIS — E7800 Pure hypercholesterolemia, unspecified: Secondary | ICD-10-CM | POA: Diagnosis not present

## 2021-12-28 DIAGNOSIS — I129 Hypertensive chronic kidney disease with stage 1 through stage 4 chronic kidney disease, or unspecified chronic kidney disease: Secondary | ICD-10-CM | POA: Diagnosis not present

## 2021-12-28 DIAGNOSIS — J449 Chronic obstructive pulmonary disease, unspecified: Secondary | ICD-10-CM | POA: Diagnosis not present

## 2021-12-28 DIAGNOSIS — K219 Gastro-esophageal reflux disease without esophagitis: Secondary | ICD-10-CM | POA: Diagnosis not present

## 2021-12-28 DIAGNOSIS — E1169 Type 2 diabetes mellitus with other specified complication: Secondary | ICD-10-CM | POA: Diagnosis not present

## 2021-12-28 DIAGNOSIS — Z0000 Encounter for general adult medical examination without abnormal findings: Secondary | ICD-10-CM | POA: Diagnosis not present

## 2022-03-11 ENCOUNTER — Institutional Professional Consult (permissible substitution): Payer: Medicare Other | Admitting: Internal Medicine

## 2022-03-22 ENCOUNTER — Ambulatory Visit (INDEPENDENT_AMBULATORY_CARE_PROVIDER_SITE_OTHER): Payer: Medicare Other

## 2022-03-22 ENCOUNTER — Ambulatory Visit: Payer: Medicare Other | Admitting: Internal Medicine

## 2022-03-22 ENCOUNTER — Encounter: Payer: Self-pay | Admitting: Internal Medicine

## 2022-03-22 VITALS — BP 128/68 | HR 48 | Temp 97.6°F | Ht 74.0 in | Wt 226.2 lb

## 2022-03-22 DIAGNOSIS — J4489 Other specified chronic obstructive pulmonary disease: Secondary | ICD-10-CM | POA: Insufficient documentation

## 2022-03-22 DIAGNOSIS — R059 Cough, unspecified: Secondary | ICD-10-CM | POA: Diagnosis not present

## 2022-03-22 DIAGNOSIS — J439 Emphysema, unspecified: Secondary | ICD-10-CM | POA: Diagnosis not present

## 2022-03-22 MED ORDER — BREZTRI AEROSPHERE 160-9-4.8 MCG/ACT IN AERO: 2.0000 | INHALATION_SPRAY | Freq: Two times a day (BID) | RESPIRATORY_TRACT | 0 refills | Status: DC

## 2022-03-22 MED ORDER — BUDESONIDE-FORMOTEROL FUMARATE 160-4.5 MCG/ACT IN AERO: INHALATION_SPRAY | RESPIRATORY_TRACT | 12 refills | Status: DC

## 2022-03-22 MED ORDER — ALBUTEROL SULFATE HFA 108 (90 BASE) MCG/ACT IN AERS: INHALATION_SPRAY | RESPIRATORY_TRACT | 1 refills | Status: DC

## 2022-03-22 NOTE — Assessment & Plan Note (Signed)
Quit smoking 1984 -  03/22/2022  After extensive coaching inhaler device,  effectiveness =    50%  continue symb 160   I think only has very mild copd but prone to AB exac so rx with symbiocort 160 for now which may be able to taper to symb 80 2bid prn if can learn good hfa Based on two studies from NEJM  378; 20 p 1865 (2018) and 380 : p2020-30 (2019) in pts with mild asthma it is reasonable to use low dose symbicort eg 80 2bid "prn" flare in this setting but I emphasized this was only shown with symbicort and takes advantage of the rapid onset of action but is not the same as "rescue therapy" but can be stopped once the acute symptoms have resolved and the need for rescue has been minimized (< 2 x weekly)    F/u in [place] office with spirometry when available.        Each maintenance medication was reviewed in detail including emphasizing most importantly the difference between maintenance and prns and under what circumstances the prns are to be triggered using an action plan format where appropriate.  Total time for H and P, chart review, counseling, reviewing hfa device(s) and generating customized AVS unique to this office visit / same day charting  > 45 min pt new to me

## 2022-03-22 NOTE — Progress Notes (Signed)
Timothy Barnes, male    DOB: 01-23-1945   MRN: [id number]   Brief patient profile:  34  yowm quit smoking 1984 s apparent resp sequelae but started using symbicort 160 around 2020 for "bronchitis"  self  referred to pulmonary clinic 03/22/2022  for severe cough in setting of viral uri not checked for covid at the time    History of Present Illness  03/22/2022  Pulmonary/ 1st office eval/Olando Willems maint on Symbicort with last prednisone "years ago" = always makes me better / on symbicort but very poor hfa baseline  Chief Complaint  Patient presents with   Consult    Cough.  Patient states hard to cough mucus up.  Sx x several years.  Prednisone helps with cough.    Dyspnea:  walks a bit slower than others anywhere he wants to go [facility] = can't walk a nl pace on a flat grade s sob but does fine slow and flat  Cough: clear mucus seems worse p lies now assoc rhinitis R>L  Sleep: flat bed/ one pillow  SABA use: none at all at time of ov      No obvious day to day or daytime pattern/variability or assoc  purulent sputum or mucus plugs or hemoptysis or cp or chest tightness, subjective wheeze or overt  hb symptoms.     Also denies any obvious fluctuation of symptoms with weather or environmental changes or other aggravating or alleviating factors except as outlined above   No unusual exposure hx or h/o childhood pna/ asthma or knowledge of premature birth.  Current Allergies, Complete Past Medical History, Past Surgical History, Family History, and Social History were reviewed in [employer] record.  ROS  The following are not active complaints unless bolded Hoarseness, sore throat, dysphagia, dental problems, itching, sneezing,  nasal congestion or discharge of excess mucus or purulent secretions, ear ache,   fever, chills, sweats, unintended wt loss or wt gain, classically pleuritic or exertional cp,  orthopnea pnd or arm/hand swelling  or leg swelling, presyncope,  palpitations, abdominal pain, anorexia, nausea, vomiting, diarrhea  or change in bowel habits or change in bladder habits, change in stools or change in urine, dysuria, hematuria,  rash, arthralgias, visual complaints, headache, numbness, weakness or ataxia or problems with walking or coordination,  change in mood or  memory.                Past Medical History:  Diagnosis Date   Chronic kidney disease    Diabetes mellitus without complication ([place])    Hypertension     Outpatient Medications Prior to Visit  Medication Sig Dispense Refill   albuterol (PROVENTIL HFA;VENTOLIN HFA) 108 (90 BASE) MCG/ACT inhaler Inhale 2 puffs into the lungs every 6 (six) hours as needed for wheezing or shortness of breath.     atorvastatin (LIPITOR) 10 MG tablet Take 10 mg by mouth daily.     budesonide-formoterol (SYMBICORT) 160-4.5 MCG/ACT inhaler Inhale 2 puffs into the lungs daily. Patient uses about 5 days a week.     hydrochlorothiazide (HYDRODIURIL) 25 MG tablet Take 25 mg by mouth every morning.     ibuprofen (ADVIL,MOTRIN) 400 MG tablet Take 1 tablet (400 mg total) by mouth every 6 (six) hours as needed. 30 tablet 0   metoprolol succinate (TOPROL-XL) 50 MG 24 hr tablet Take 50 mg by mouth daily.     olmesartan (BENICAR) 40 MG tablet Take 40 mg by mouth daily.     omeprazole (  PRILOSEC) 40 MG capsule Take 40 mg by mouth daily.            HYDROcodone-acetaminophen (NORCO/VICODIN) 5-325 MG tablet Take 1 tablet by mouth every 6 (six) hours as needed. (Patient not taking: Reported on 02/09/2018) 10 tablet 0   lidocaine (LIDODERM) 5 % Place 1 patch onto the skin daily. Remove & Discard patch within 12 hours or as directed by MD (Patient not taking: Reported on 03/22/2022) 30 patch 0   methocarbamol (ROBAXIN) 500 MG tablet Take 1 tablet (500 mg total) by mouth 2 (two) times daily. (Patient not taking: Reported on 02/09/2018) 20 tablet 0   sildenafil (REVATIO) 20 MG tablet Take 20 mg by mouth 3 (three) times daily.  (Patient not taking: Reported on 03/22/2022)     losartan-hydrochlorothiazide (HYZAAR) 100-12.5 MG per tablet Take 1 tablet by mouth daily.     metoprolol (LOPRESSOR) 50 MG tablet Take 50 mg by mouth 2 (two) times daily.     No facility-administered medications prior to visit.     Objective:     BP 128/68 (BP Location: Left Arm, Patient Position: Sitting, Cuff Size: Large)   Pulse (!) 48   Temp 97.6 F (36.4 C) (Oral)   Ht 6\' 2"  (1.88 m)   Wt 226 lb 3.2 oz (102.6 kg)   SpO2 97%   BMI 29.04 kg/m   SpO2: 97 % amb wm easily confused with details of care    HEENT : Oropharynx  clear/dentition intact   Nasal turbinates mild non-specific turbinate edema bilaterally    NECK :  without  apparent JVD/ palpable Nodes/TM    LUNGS: no acc muscle use,  Min barrel  contour chest wall with bilateral  slightly decreased bs s audible wheeze and  without cough on insp or exp maneuvers and min  Hyperresonant  to  percussion bilaterally    CV:  RRR  no s3 or murmur or increase in P2, and no edema   ABD:  soft and nontender with pos end  insp Hoover's  in the supine position.  No bruits or organomegaly appreciated   MS:  Nl gait/ ext warm without deformities Or obvious joint restrictions  calf tenderness, cyanosis or clubbing     SKIN: warm and dry without lesions    NEURO:  alert, approp, nl sensorium with  no motor or cerebellar deficits apparent.        CXR PA and Lateral:   03/22/2022 :    I personally reviewed images and impression is as follows:     Mild copd/ min T kyphosis no acute findings     Assessment   Asthmatic bronchitis , chronic Quit smoking 1984 -  03/22/2022  After extensive coaching inhaler device,  effectiveness =    50%  continue symb 160   I think only has very mild copd but prone to AB exac so rx with symbiocort 160 for now which may be able to taper to symb 80 2bid prn if can learn good hfa Based on two studies from NEJM  378; 20 p 1865 (2018) and 380 : p2020-30  (2019) in pts with mild asthma it is reasonable to use low dose symbicort eg 80 2bid "prn" flare in this setting but I emphasized this was only shown with symbicort and takes advantage of the rapid onset of action but is not the same as "rescue therapy" but can be stopped once the acute symptoms have resolved and the need for rescue has been  minimized (< 2 x weekly)        Christinia Gully, MD 03/22/2022

## 2022-03-22 NOTE — Patient Instructions (Signed)
Plan A = Automatic = Always=    symbicort 160 (breztri) Take 2 puffs first thing in am and  then 2 puffs about 12 hours later if needed    Work on inhaler technique:  relax and gently blow all the way out then take a nice smooth full deep breath back in, triggering the inhaler at same time you start breathing in.  Hold breath in for at least  5 seconds if you can. Blow out symb thru nose. Rinse and gargle with water when done.  If mouth or throat bother you at all,  try brushing teeth/gums/tongue with arm and hammer toothpaste/ make a slurry and gargle and spit out.     Plan B = Backup (to supplement plan A, not to replace it) Only use your albuterol inhaler as a rescue medication to be used if you can't catch your breath by resting or doing a relaxed purse lip breathing pattern.  - The less you use it, the better it will work when you need it. - Ok to use the inhaler up to 2 puffs  every 4 hours if you must but call for appointment if use goes up over your usual need - Don't leave home without it !!  (think of it like the spare tire for your car)    Please remember to go to the  x-ray department  for your tests - we will call you with the results when they are available     Please schedule a follow up office visit in 4 weeks, sooner if needed  with all medications /inhalers/ solutions in hand so we can verify exactly what you are taking. This includes all medications from all doctors and over the counters - [facility] office

## 2022-03-23 ENCOUNTER — Encounter: Payer: Self-pay | Admitting: Internal Medicine

## 2022-03-30 DIAGNOSIS — H40033 Anatomical narrow angle, bilateral: Secondary | ICD-10-CM | POA: Diagnosis not present

## 2022-03-30 DIAGNOSIS — H524 Presbyopia: Secondary | ICD-10-CM | POA: Diagnosis not present

## 2022-03-30 DIAGNOSIS — H2513 Age-related nuclear cataract, bilateral: Secondary | ICD-10-CM | POA: Diagnosis not present

## 2022-04-18 NOTE — Progress Notes (Signed)
Timothy Barnes, male    DOB: Jun 01, 1945   MRN: [id number]   Brief patient profile:  42 yowm quit smoking 1984 s apparent resp sequelae but started using symbicort 160 around 2020 for "bronchitis"  self  referred to pulmonary clinic 03/22/2022  for severe cough in setting of viral uri not checked for covid at the time    History of Present Illness  03/22/2022  Pulmonary/ 1st office eval/Lanah Steines maint on Symbicort with last prednisone "years ago" = always makes me better / on symbicort but very poor hfa baseline  Chief Complaint  Patient presents with   Consult    Cough.  Patient states hard to cough mucus up.  Sx x several years.  Prednisone helps with cough.    Dyspnea:  walks a bit slower than others anywhere he wants to go [facility] = can't walk a nl pace on a flat grade s sob but does fine slow and flat  Cough: clear mucus seems worse p lies now assoc rhinitis R>L  Sleep: flat bed/ one pillow  SABA use: none at all at time of ov Rec Plan A = Automatic = Always=    symbicort 160 (breztri) Take 2 puffs first thing in am and  then 2 puffs about 12 hours later if needed  Work on inhaler technique:  Plan B = Backup (to supplement plan A, not to replace it) Only use your albuterol inhaler as a rescue medication  Cxr = Emphysema without evidence of acute cardiopulmonary disease   Please schedule a follow up office visit in 4 weeks, sooner if needed  with all medications /inhalers/ solutions in hand    04/19/2022  f/u ov/[place] office/Dalilah Curlin re: AB maint on symb 160 2bid   did not bring meds/ says list is correct  Chief Complaint  Patient presents with   Follow-up    Pt f/u states that he is doing well  Dyspnea:  Not limited by breathing from desired activities   Cough: gone  Sleeping: flat bed fine  SABA use: none  02: none   No obvious day to day or daytime variability or assoc excess/ purulent sputum or mucus plugs or hemoptysis or cp or chest tightness, subjective wheeze or  overt sinus or hb symptoms.   Sleeping  without nocturnal  or early am exacerbation  of respiratory  c/o's or need for noct saba. Also denies any obvious fluctuation of symptoms with weather or environmental changes or other aggravating or alleviating factors except as outlined above   No unusual exposure hx or h/o childhood pna/ asthma or knowledge of premature birth.  Current Allergies, Complete Past Medical History, Past Surgical History, Family History, and Social History were reviewed in [employer] record.  ROS  The following are not active complaints unless bolded Hoarseness, sore throat, dysphagia, dental problems, itching, sneezing,  nasal congestion or discharge of excess mucus or purulent secretions, ear ache,   fever, chills, sweats, unintended wt loss or wt gain, classically pleuritic or exertional cp,  orthopnea pnd or arm/hand swelling  or leg swelling, presyncope, palpitations, abdominal pain, anorexia, nausea, vomiting, diarrhea  or change in bowel habits or change in bladder habits, change in stools or change in urine, dysuria, hematuria,  rash, arthralgias, visual complaints, headache, numbness, weakness or ataxia or problems with walking or coordination,  change in mood or  memory.        Current Meds  Medication Sig   albuterol (PROAIR HFA) 108 (90 Base)  MCG/ACT inhaler 2 puffs every 4 hours as needed only  if your can't catch your breath   atorvastatin (LIPITOR) 10 MG tablet Take 10 mg by mouth daily.   Budeson-Glycopyrrol-Formoterol (BREZTRI AEROSPHERE) 160-9-4.8 MCG/ACT AERO Inhale 2 puffs into the lungs in the morning and at bedtime.   budesonide-formoterol (SYMBICORT) 160-4.5 MCG/ACT inhaler Take 2 puffs first thing in am and then another 2 puffs about 12 hours later.   hydrochlorothiazide (HYDRODIURIL) 25 MG tablet Take 25 mg by mouth every morning.   ibuprofen (ADVIL,MOTRIN) 400 MG tablet Take 1 tablet (400 mg total) by mouth every 6 (six) hours  as needed.   metoprolol succinate (TOPROL-XL) 50 MG 24 hr tablet Take 50 mg by mouth daily.   olmesartan (BENICAR) 40 MG tablet Take 40 mg by mouth daily.   omeprazole (PRILOSEC) 40 MG capsule Take 40 mg by mouth daily.                          Past Medical History:  Diagnosis Date   Chronic kidney disease    Diabetes mellitus without complication (HCC)    Hypertension       Objective:     04/19/2022       224   03/22/22 226 lb 3.2 oz (102.6 kg)  02/09/18 254 lb (115.2 kg)  03/11/16 232 lb (105.2 kg)      Vital signs reviewed  04/19/2022  - Note at rest 02 sats  95% on RA and pulse in 40s = baseline/ asymptomatic   General appearance:    pleasant well preserved amb wm nad      HEENT : Oropharynx  clear      NECK :  without  apparent JVD/ palpable Nodes/TM    LUNGS: no acc muscle use,  Min barrel  contour chest wall with bilateral  slightly decreased bs s audible wheeze and  without cough on insp or exp maneuvers and min  Hyperresonant  to  percussion bilaterally    CV:  RRR  no s3 or murmur or increase in P2, and no edema   ABD:  soft and nontender with pos end  insp Hoover's  in the supine position.  No bruits or organomegaly appreciated   MS:  Nl gait/ ext warm without deformities Or obvious joint restrictions  calf tenderness, cyanosis or clubbing     SKIN: warm and dry without lesions    NEURO:  alert, approp, nl sensorium with  no motor or cerebellar deficits apparent.             Assessment

## 2022-04-19 ENCOUNTER — Ambulatory Visit: Payer: Medicare Other | Admitting: Internal Medicine

## 2022-04-19 ENCOUNTER — Encounter: Payer: Self-pay | Admitting: Internal Medicine

## 2022-04-19 VITALS — BP 125/65 | HR 44 | Ht 74.0 in | Wt 224.0 lb

## 2022-04-19 DIAGNOSIS — J4489 Other specified chronic obstructive pulmonary disease: Secondary | ICD-10-CM | POA: Diagnosis not present

## 2022-04-19 NOTE — Assessment & Plan Note (Signed)
Quit smoking 1984 -  03/22/2022  After extensive coaching inhaler device,  effectiveness =    50%  continue symb 160 2 bid > symptoms completely resolved 04/19/2022   Marked improvement on symb 160 2bid so ok to taper to floor of symb 160 one each am based on two studies from NEJM  378; 20 p 1865 (2018) and 380 : p2020-30 (2019) in pts with mild asthma it is reasonable to use low dose symbicort  2 bid "prn" flare in this setting but I emphasized this was only shown with symbicort and takes advantage of the rapid onset of action but is not the same as "rescue therapy" but can be stopped once the acute symptoms have resolved and the need for rescue has been minimized (< 2 x weekly)    - The proper method of use, as well as anticipated side effects, of a metered-dose inhaler were discussed and demonstrated to the patient using teach back method using empty symbicort device   F/u in 6 m, sooner prn           Each maintenance medication was reviewed in detail including emphasizing most importantly the difference between maintenance and prns and under what circumstances the prns are to be triggered using an action plan format where appropriate.  Total time for H and P, chart review, counseling, reviewing hfa device(s) and generating customized AVS unique to this office visit / same day charting = 21 min

## 2022-04-19 NOTE — Patient Instructions (Signed)
Plan A = Automatic = Always=    Symbicort 160 1-2 every 12 hours   Work on inhaler technique:  relax and gently blow all the way out then take a nice smooth full deep breath back in, triggering the inhaler at same time you start breathing in.  Hold breath in for at least  5 seconds if you can. Blow out Symbicort  thru nose. Rinse and gargle with water when done.  If mouth or throat bother you at all,  try brushing teeth/gums/tongue with arm and hammer toothpaste/ make a slurry and gargle and spit out.      Plan B = Backup (to supplement plan A, not to replace it) Only use your albuterol inhaler as a rescue medication to be used if you can't catch your breath by resting or doing a relaxed purse lip breathing pattern.  - The less you use it, the better it will work when you need it. - Ok to use the inhaler up to 2 puffs  every 4 hours if you must but call for appointment if use goes up over your usual need - Don't leave home without it !!  (think of it like the spare tire for your car)     Please schedule a follow up visit in 6  months but call sooner if needed - bring inhalers

## 2022-06-29 DIAGNOSIS — N1831 Chronic kidney disease, stage 3a: Secondary | ICD-10-CM | POA: Diagnosis not present

## 2022-06-29 DIAGNOSIS — J449 Chronic obstructive pulmonary disease, unspecified: Secondary | ICD-10-CM | POA: Diagnosis not present

## 2022-06-29 DIAGNOSIS — E1122 Type 2 diabetes mellitus with diabetic chronic kidney disease: Secondary | ICD-10-CM | POA: Diagnosis not present

## 2022-06-29 DIAGNOSIS — Z6830 Body mass index (BMI) 30.0-30.9, adult: Secondary | ICD-10-CM | POA: Diagnosis not present

## 2022-06-29 DIAGNOSIS — E1169 Type 2 diabetes mellitus with other specified complication: Secondary | ICD-10-CM | POA: Diagnosis not present

## 2022-10-24 NOTE — Progress Notes (Unsigned)
AJAK YOSHIMURA, male    DOB: February 07, 1945   MRN: [id number]   Brief patient profile:  89 yowm quit smoking 1984 s apparent resp sequelae but started using symbicort 160 around 2020 for "bronchitis"  self  referred to pulmonary clinic 03/22/2022  for severe cough in setting of viral uri not checked for covid at the time    History of Present Illness  03/22/2022  Pulmonary/ 1st office eval/Saleem Coccia maint on Symbicort with last prednisone "years ago" = always makes me better / on symbicort but very poor hfa baseline  Chief Complaint  Patient presents with   Consult    Cough.  Patient states hard to cough mucus up.  Sx x several years.  Prednisone helps with cough.    Dyspnea:  walks a bit slower than others anywhere he wants to go [facility] = can't walk a nl pace on a flat grade s sob but does fine slow and flat  Cough: clear mucus seems worse p lies now assoc rhinitis R>L  Sleep: flat bed/ one pillow  SABA use: none at all at time of ov Rec Plan A = Automatic = Always=    symbicort 160 (breztri) Take 2 puffs first thing in am and  then 2 puffs about 12 hours later if needed  Work on inhaler technique:  Plan B = Backup (to supplement plan A, not to replace it) Only use your albuterol inhaler as a rescue medication  Cxr = Emphysema without evidence of acute cardiopulmonary disease   Please schedule a follow up office visit in 4 weeks, sooner if needed  with all medications /inhalers/ solutions in hand    04/19/2022  f/u ov/[place] office/Bannie Lobban re: AB maint on symb 160 2bid   did not bring meds/ says list is correct  Chief Complaint  Patient presents with   Follow-up    Pt f/u states that he is doing well  Dyspnea:  Not limited by breathing from desired activities   Cough: gone  Sleeping: flat bed fine  SABA use: none  02: none   Rec Plan A = Automatic = Always=    Symbicort 160 1-2 every 12 hours  Work on inhaler technique:   Plan B = Backup (to supplement plan A, not to replace  it) Only use your albuterol inhaler as a rescue medication   Please schedule a follow up visit in 6  months but call sooner if needed - bring inhalers   10/25/2022  f/u ov/[place] office/Kadejah Sandiford re: *** maint on *** did *** bring inhalers  No chief complaint on file.   Dyspnea:  *** Cough: *** Sleeping: ***   resp cc  SABA use: *** 02: ***  Lung cancer screening: ***   No obvious day to day or daytime variability or assoc excess/ purulent sputum or mucus plugs or hemoptysis or cp or chest tightness, subjective wheeze or overt sinus or hb symptoms.    Also denies any obvious fluctuation of symptoms with weather or environmental changes or other aggravating or alleviating factors except as outlined above   No unusual exposure hx or h/o childhood pna/ asthma or knowledge of premature birth.  Current Allergies, Complete Past Medical History, Past Surgical History, Family History, and Social History were reviewed in [employer] record.  ROS  The following are not active complaints unless bolded Hoarseness, sore throat, dysphagia, dental problems, itching, sneezing,  nasal congestion or discharge of excess mucus or purulent secretions, ear ache,   fever,  chills, sweats, unintended wt loss or wt gain, classically pleuritic or exertional cp,  orthopnea pnd or arm/hand swelling  or leg swelling, presyncope, palpitations, abdominal pain, anorexia, nausea, vomiting, diarrhea  or change in bowel habits or change in bladder habits, change in stools or change in urine, dysuria, hematuria,  rash, arthralgias, visual complaints, headache, numbness, weakness or ataxia or problems with walking or coordination,  change in mood or  memory.        No outpatient medications have been marked as taking for the 10/25/22 encounter (Appointment) with Nyoka Cowden, MD.                        Past Medical History:  Diagnosis Date   Chronic kidney disease    Diabetes mellitus  without complication (HCC)    Hypertension       Objective:    Wts  10/25/2022      ***  04/19/2022       224   03/22/22 226 lb 3.2 oz (102.6 kg)  02/09/18 254 lb (115.2 kg)  03/11/16 232 lb (105.2 kg)    Vital signs reviewed  10/25/2022  - Note at rest 02 sats  ***% on ***   General appearance:    ***      Min bar***       Assessment

## 2022-10-25 ENCOUNTER — Ambulatory Visit: Payer: Medicare Other | Admitting: Internal Medicine

## 2022-10-25 ENCOUNTER — Encounter: Payer: Self-pay | Admitting: Internal Medicine

## 2022-10-25 VITALS — BP 146/74 | HR 44 | Ht 74.0 in | Wt 233.0 lb

## 2022-10-25 DIAGNOSIS — J4489 Other specified chronic obstructive pulmonary disease: Secondary | ICD-10-CM | POA: Diagnosis not present

## 2022-10-25 MED ORDER — BREZTRI AEROSPHERE 160-9-4.8 MCG/ACT IN AERO: 2.0000 | INHALATION_SPRAY | Freq: Two times a day (BID) | RESPIRATORY_TRACT | Status: AC

## 2022-10-25 NOTE — Assessment & Plan Note (Signed)
Quit smoking 1984 -  03/22/2022  After extensive coaching inhaler device,  effectiveness =    50%  continue symb 160 2 bid > symptoms completely resolved 04/19/2022  - 10/25/2022  After extensive coaching inhaler device,  effectiveness =    50% (poor insp) so given empty cannister of breztri for breztri   Relatively mild AB so can rx as Asthma not copd with  Based on two studies from NEJM  378; 20 p 1865 160 2bid "prn" flare in this setting but I emphasized this was only shown with symbicort and takes advantage of the rapid onset of action but is not the same as "rescue therapy" but can be stopped once the acute symptoms have resolved and the need for rescue has been minimized (< 2 x weekly)    F/u 6 m, sooner prn     Each maintenance medication was reviewed in detail including emphasizing most importantly the difference between maintenance and prns and under what circumstances the prns are to be triggered using an action plan format where appropriate.  Total time for H and P, chart review, counseling, reviewing hfa device(s) and generating customized AVS unique to this office visit / same day charting > 30 min

## 2022-10-25 NOTE — Patient Instructions (Addendum)
Plan A = Automatic = Always=    Symbicort (Breyna) 160 1-2 every 12 hours  If needed but in event of flare use it perfectly regularly for at least a week (including now) until better then taper  Work on inhaler technique:  relax and gently blow all the way out then take a nice smooth full deep breath back in, triggering the inhaler at same time you start breathing in.  Hold breath in for at least  5 seconds if you can. Blow out Symbicort/Breyna/Brezti out  thru nose. Rinse and gargle with water when done.  If mouth or throat bother you at all,  try brushing teeth/gums/tongue with arm and hammer toothpaste/ make a slurry and gargle and spit out.   >>>  Remember how golfers warm up by taking practice swings - do this with an empty inhaler  Plan B = Backup (to supplement plan A, not to replace it) Only use your albuterol inhaler as a rescue medication to be used if you can't catch your breath by resting or doing a relaxed purse lip breathing pattern.  - The less you use it, the better it will work when you need it. - Ok to use the inhaler up to 2 puffs  every 4 hours if you must but call for appointment if use goes up over your usual need - Don't leave home without it !!  (think of it like the spare tire for your car)     Please schedule a follow up visit in 6  months but call sooner if needed - bring inhalers

## 2022-12-23 DIAGNOSIS — E7800 Pure hypercholesterolemia, unspecified: Secondary | ICD-10-CM | POA: Diagnosis not present

## 2022-12-23 DIAGNOSIS — E1122 Type 2 diabetes mellitus with diabetic chronic kidney disease: Secondary | ICD-10-CM | POA: Diagnosis not present

## 2022-12-23 DIAGNOSIS — N1831 Chronic kidney disease, stage 3a: Secondary | ICD-10-CM | POA: Diagnosis not present

## 2022-12-23 DIAGNOSIS — I129 Hypertensive chronic kidney disease with stage 1 through stage 4 chronic kidney disease, or unspecified chronic kidney disease: Secondary | ICD-10-CM | POA: Diagnosis not present

## 2022-12-30 DIAGNOSIS — Z0000 Encounter for general adult medical examination without abnormal findings: Secondary | ICD-10-CM | POA: Diagnosis not present

## 2023-06-13 DIAGNOSIS — N1831 Chronic kidney disease, stage 3a: Secondary | ICD-10-CM | POA: Diagnosis not present

## 2023-06-13 DIAGNOSIS — E1122 Type 2 diabetes mellitus with diabetic chronic kidney disease: Secondary | ICD-10-CM | POA: Diagnosis not present

## 2023-06-13 DIAGNOSIS — E7800 Pure hypercholesterolemia, unspecified: Secondary | ICD-10-CM | POA: Diagnosis not present

## 2023-06-13 DIAGNOSIS — I129 Hypertensive chronic kidney disease with stage 1 through stage 4 chronic kidney disease, or unspecified chronic kidney disease: Secondary | ICD-10-CM | POA: Diagnosis not present

## 2023-12-20 DIAGNOSIS — E7800 Pure hypercholesterolemia, unspecified: Secondary | ICD-10-CM | POA: Diagnosis not present

## 2023-12-20 DIAGNOSIS — J449 Chronic obstructive pulmonary disease, unspecified: Secondary | ICD-10-CM | POA: Diagnosis not present

## 2023-12-20 DIAGNOSIS — E1122 Type 2 diabetes mellitus with diabetic chronic kidney disease: Secondary | ICD-10-CM | POA: Diagnosis not present

## 2023-12-20 DIAGNOSIS — Z1331 Encounter for screening for depression: Secondary | ICD-10-CM | POA: Diagnosis not present

## 2023-12-20 DIAGNOSIS — Z0000 Encounter for general adult medical examination without abnormal findings: Secondary | ICD-10-CM | POA: Diagnosis not present

## 2023-12-20 DIAGNOSIS — N1831 Chronic kidney disease, stage 3a: Secondary | ICD-10-CM | POA: Diagnosis not present

## 2024-01-17 ENCOUNTER — Other Ambulatory Visit: Payer: Self-pay | Admitting: Internal Medicine

## 2024-01-17 NOTE — Telephone Encounter (Signed)
 Courtesy refill - pt has not been seen since Oct. 2024. Pt will need appt for further refills.
